# Patient Record
Sex: Female | Born: 2005 | Race: White | Hispanic: No | Marital: Single | Smoking: Former smoker
Health system: Southern US, Community
[De-identification: ages and names within clinical notes are randomized; demographics above are authoritative.]

## PROBLEM LIST (undated history)

## (undated) DIAGNOSIS — F419 Anxiety disorder, unspecified: Secondary | ICD-10-CM

## (undated) DIAGNOSIS — R569 Unspecified convulsions: Secondary | ICD-10-CM

## (undated) DIAGNOSIS — F99 Mental disorder, not otherwise specified: Secondary | ICD-10-CM

## (undated) DIAGNOSIS — R011 Cardiac murmur, unspecified: Secondary | ICD-10-CM

## (undated) HISTORY — PX: TONSILLECTOMY AND ADENOIDECTOMY: SHX28

## (undated) HISTORY — PX: MULTIPLE TOOTH EXTRACTIONS: SHX2053

## (undated) HISTORY — DX: Anxiety disorder, unspecified: F41.9

## (undated) HISTORY — PX: TONSILLECTOMY: SUR1361

## (undated) HISTORY — DX: Mental disorder, not otherwise specified: F99

---
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
## 2023-02-08 IMAGING — US US MFM OB DETAIL+14 WK
1 series · 12 of 28 positions shown · non-contrast
Comparison: none

[Series 1: us mfm ob detail+14 wk · 114 acquisitions, 12 frames shown]
[im 5/114]
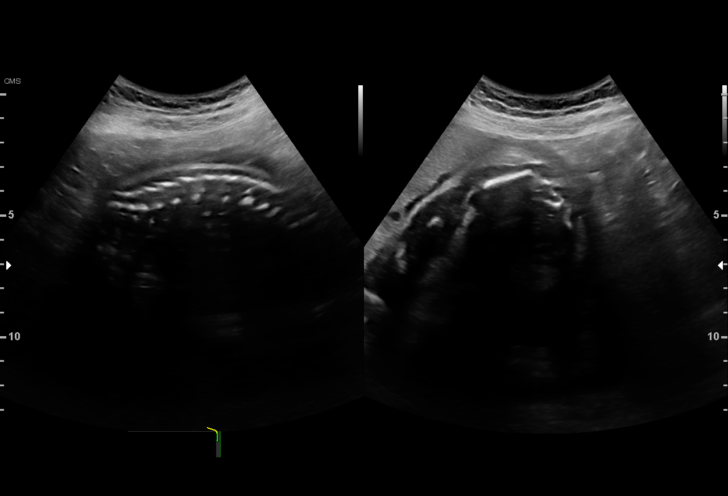
[im 13/114]
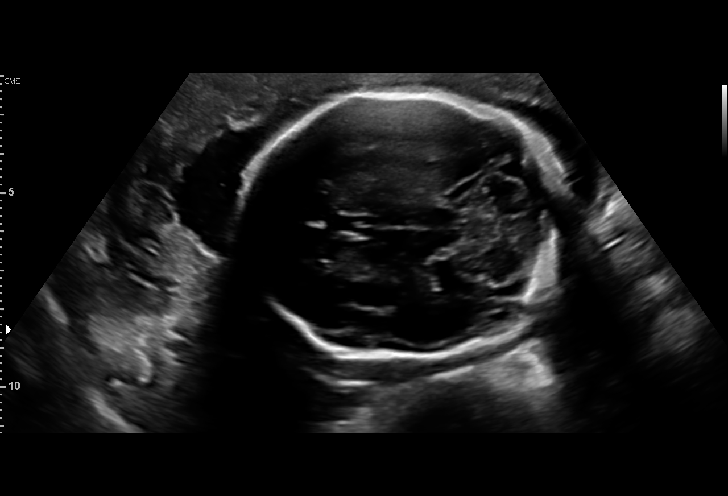
[im 21/114]
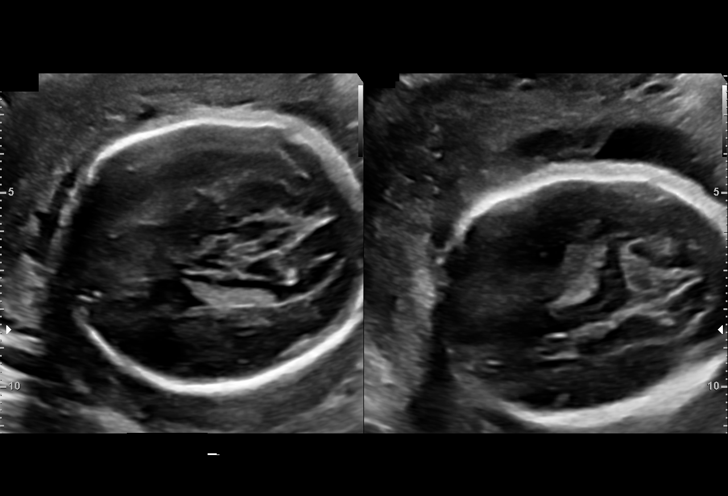
[im 34/114]
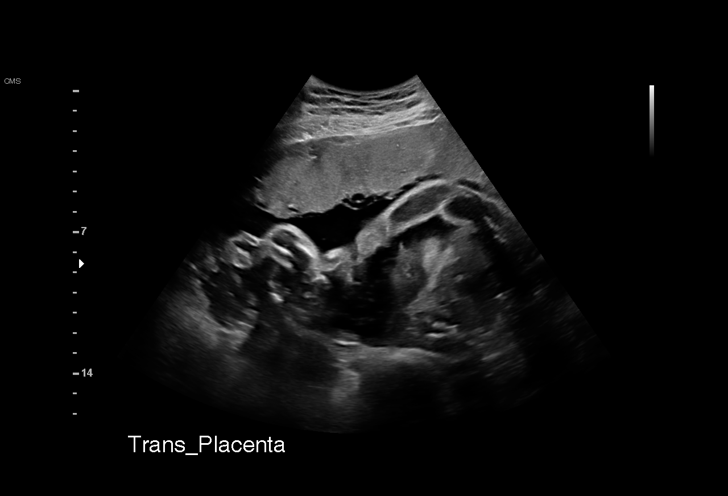
[im 42/114]
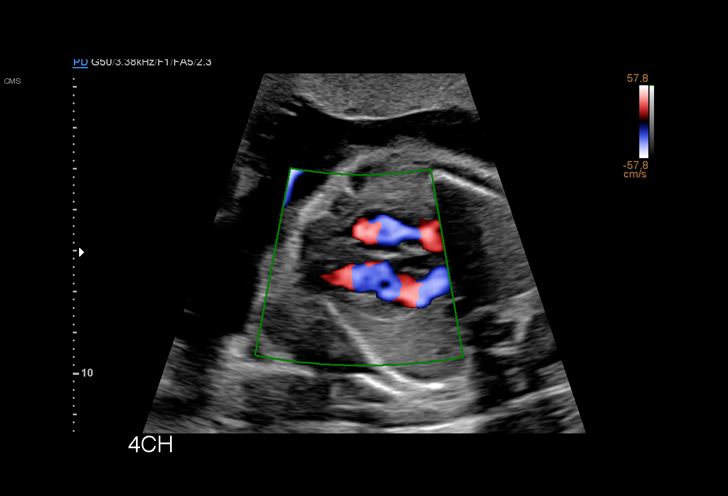
[im 51/114]
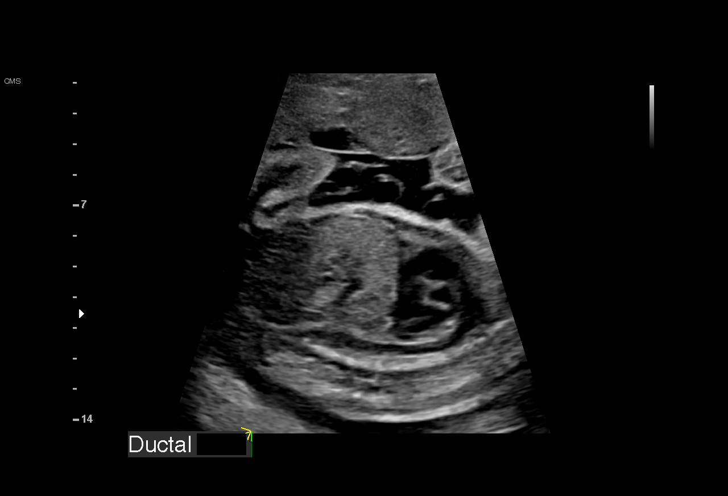
[im 63/114]
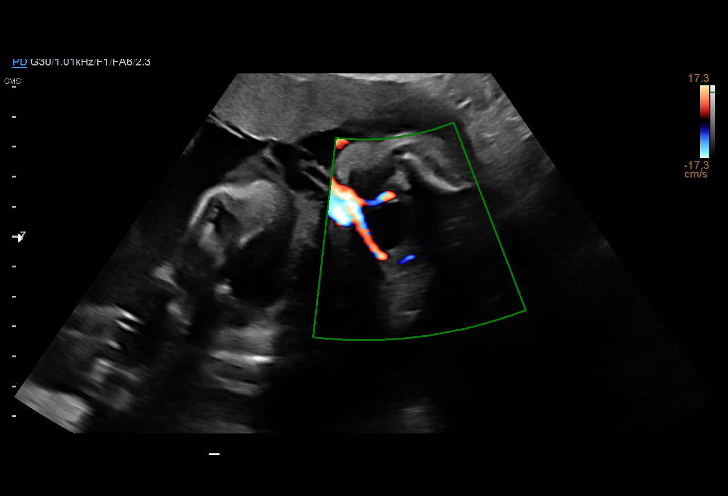
[im 72/114]
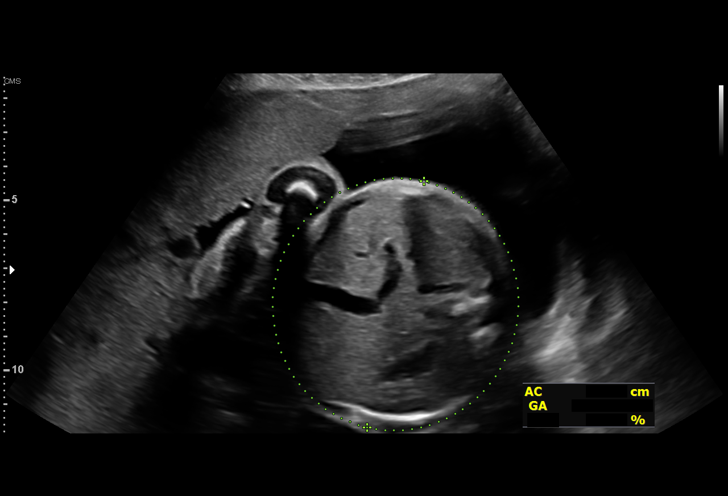
[im 80/114]
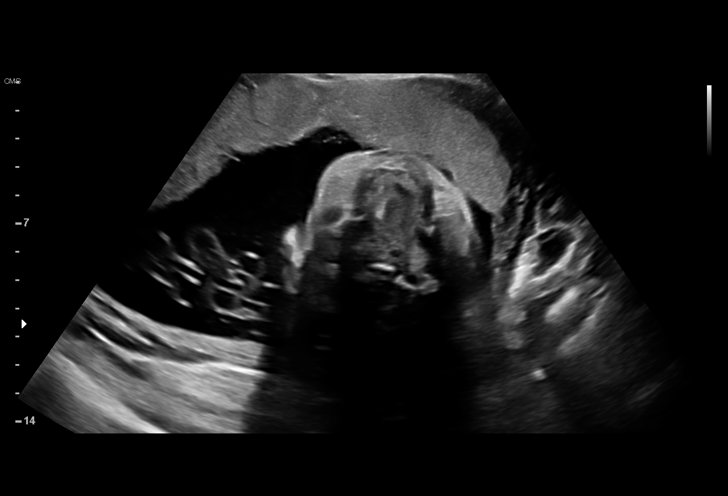
[im 93/114]
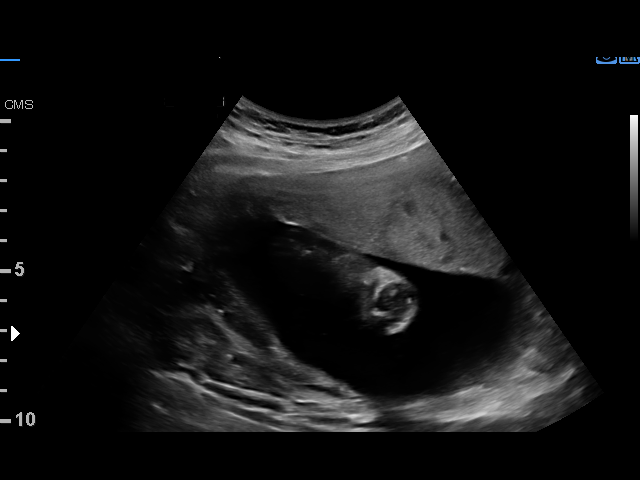
[im 101/114]
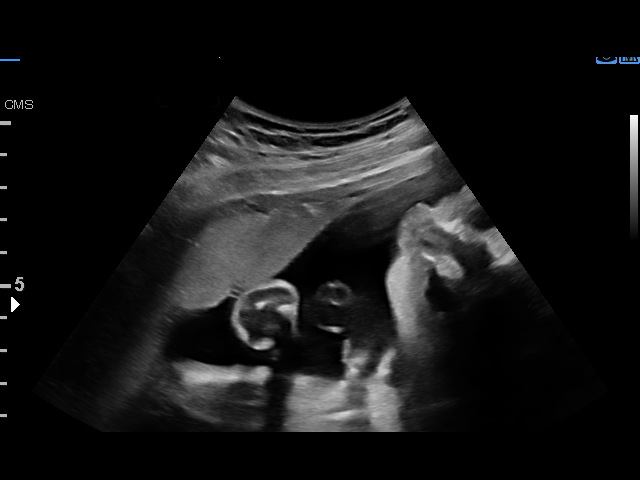
[im 109/114]
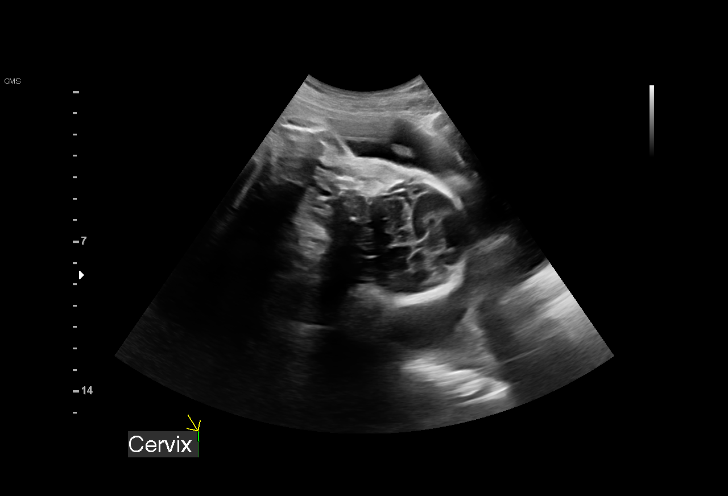

[12 of 28 positions shown; findings below may reference images not displayed]

Indications

 Poor obstetric history: Previous preterm
 delivery, antepartum (34 weeks)
 Encounter for uncertain dates
 27 weeks gestation of pregnancy
 Antenatal screening for malformations
 Short interval between pregancies, 2nd
 trimester
 Heart disease in miother affecting pregnancy
 in second trimester (hx of murmur)
Fetal Evaluation

 Num Of Fetuses:         1
 Fetal Heart Rate(bpm):  157
 Cardiac Activity:       Observed
 Presentation:           Cephalic
 Placenta:               Anterior
 P. Cord Insertion:      Visualized

 Amniotic Fluid
 AFI FV:      Within normal limits

                             Largest Pocket(cm)

Biometry
 BPD:      67.7  mm     G. Age:  27w 2d         32  %    CI:        71.23   %    70 - 86
                                                         FL/HC:      20.0   %    18.6 -
 HC:      255.5  mm     G. Age:  27w 5d         31  %    HC/AC:      1.12        1.05 -
 AC:      227.8  mm     G. Age:  27w 1d         33  %    FL/BPD:     75.6   %    71 - 87
 FL:       51.2  mm     G. Age:  27w 3d         34  %    FL/AC:      22.5   %    20 - 24
 HUM:      46.2  mm     G. Age:  27w 2d         43  %
 CER:      30.5  mm     G. Age:  26w 4d         37  %

 LV:        3.7  mm
 CM:        4.5  mm

 Est. FW:    8183  gm      2 lb 5 oz     33  %
OB History

 Gravidity:    2         Term:   0        Prem:   1        SAB:   0
 TOP:          0       Ectopic:  0        Living: 1
Gestational Age

 LMP:           28w 3d        Date:  07/26/20                 EDD:   05/02/21
 U/S Today:     27w 3d                                        EDD:   05/09/21
 Best:          27w 3d     Det. By:  U/S (02/10/21)           EDD:   05/09/21
Anatomy

 Cranium:               Appears normal         LVOT:                   Appears normal
 Cavum:                 Appears normal         Aortic Arch:            Appears normal
 Ventricles:            Appears normal         Ductal Arch:            Appears normal
 Choroid Plexus:        Appears normal         Diaphragm:              Appears normal
 Cerebellum:            Appears normal         Stomach:                Appears normal, left
                                                                       sided
 Posterior Fossa:       Appears normal         Abdomen:                Appears normal
 Nuchal Fold:           Not applicable (>20    Abdominal Wall:         Appears nml (cord
                        wks GA)                                        insert, abd wall)
 Face:                  Appears normal         Cord Vessels:           Appears normal (3
                        (orbits and profile)                           vessel cord)
 Lips:                  Appears normal         Kidneys:                Appear normal
 Palate:                Appears normal         Bladder:                Appears normal
 Thoracic:              Appears normal         Spine:                  Appears normal
 Heart:                 Appears normal         Upper Extremities:      Appears normal
                        (4CH, axis, and
                        situs)
 RVOT:                  Appears normal         Lower Extremities:      Appears normal
Targeted Anatomy

 Central Nervous System
 Cereb./Vermis:         Appears normal

 Head/Neck
 Nasal Bone:            Present                Mandible:               Appears normal
 Orbits/Eyes:           Nml w/ lenses          Maxilla:                Appears normal

 Thorax
 SVC:                   Appears normal         3 V Trachea View:       Appears normal
 Interventr. Septum:    Appears normal         IVC:                    Appears normal
 3 Vessel View:         Appears normal
 Extremities
 Lt Hand:               Open hand nml          Lt Foot:                Heels nwv
 Rt Hand:               Open hand nml          Rt Foot:                Nml w/ heel

 Other
 Genitalia:             Normal Female
Cervix Uterus Adnexa

 Cervix
 Length:            3.5  cm.
 Normal appearance by transabdominal scan.

 Uterus
 No abnormality visualized.

 Right Ovary
 Within normal limits.

 Left Ovary
 Not visualized.

 Cul De Sac
 No free fluid seen.

 Adnexa
 No abnormality visualized.
Impression

 On today's ultrasound, the fetal biometry is consistent with 27
 weeks and 3 days gestation.  Amniotic fluid is normal good
 fetal activity seen.  Fetal anatomical survey appears normal
 but limited by advanced gestational age.  On transabdominal
 scan, the cervix looks long and closed.
 Patient does not have symptoms of uterine contractions or
 pelvic pressure.
 xxxxxxxxxxxxxxxxxxxxxxxxxxxxxxxxx
 Consultation

 I had the pleasure of seeing Ms. Biggs today at the
 [HOSPITAL]. She is G2 P1 at unknown
 gestational age and is here for ultrasound and consultation.
 She initiated her prenatal care late in pregnancy and has not
 had ultrasound in this pregnancy.
 She has not had screening for fetal aneuploidies.  She does
 not have gestational diabetes.
 Obstetric history is significant for a preterm vaginal delivery in
 December 2019 at 34 weeks gestation of a female infant weighing
 5 pounds and 8 ounces at birth.  The newborn was
 discharged after 2-week stay at NICU.
 She had spontaneous preterm birth.  Patient had 1
 ultrasound in that pregnancy and there was no mention of
 cervical shortening.
 GYN history: No history of cervical surgeries.
 Past medical history: No history of diabetes or hypertension
 or any chronic medical conditions.
 Past surgical history: Tonsillectomy.
 Medications: Prenatal vitamins.
 Allergies: Amoxicillin (hives), penicillin (hives).
 Social history: Denies tobacco or drug or alcohol use.  Her
 partner is an African-American and he is in good health he is
 now the father of her first child.
 Family history: No history of venous thromboembolism in the
 family.
 Blood pressure today at her office is 109/66 mmHg.
 Our concerns include
 History of spontaneous preterm birth
 This is the single most-common cause of recurrent preterm
 birth. I discussed progesterone prophylaxis that is usually
 initiated before 24 weeks' gestation. In the absence of
 infection or bleeding, her previous preterm birth has no
 identifiable cause.
 I discussed general measures including good hydration and
 educated her on the symptoms and signs of preterm labor. I
 briefly discussed tocolysis and antenatal corticosteroids if she
 goes into preterm labor.
Recommendations

 -An appointment was made for her to return in 4 weeks for
 fetal growth assessment.
                 Ranks, Kofi Mensah

## 2023-06-16 ENCOUNTER — Emergency Department (HOSPITAL_COMMUNITY): Payer: MEDICAID

## 2023-06-16 ENCOUNTER — Emergency Department (HOSPITAL_COMMUNITY)
Admission: EM | Admit: 2023-06-16 | Discharge: 2023-06-17 | Disposition: A | Discharge status: Home / Self Care | Payer: MEDICAID | Attending: Emergency Medicine | Admitting: Emergency Medicine

## 2023-06-16 ENCOUNTER — Encounter (HOSPITAL_COMMUNITY): Payer: Self-pay

## 2023-06-16 ENCOUNTER — Other Ambulatory Visit: Payer: Self-pay

## 2023-06-16 DIAGNOSIS — R630 Anorexia: Secondary | ICD-10-CM | POA: Insufficient documentation

## 2023-06-16 DIAGNOSIS — R1031 Right lower quadrant pain: Secondary | ICD-10-CM | POA: Insufficient documentation

## 2023-06-16 DIAGNOSIS — R109 Unspecified abdominal pain: Secondary | ICD-10-CM

## 2023-06-16 DIAGNOSIS — R197 Diarrhea, unspecified: Secondary | ICD-10-CM | POA: Insufficient documentation

## 2023-06-16 DIAGNOSIS — R112 Nausea with vomiting, unspecified: Secondary | ICD-10-CM | POA: Insufficient documentation

## 2023-06-16 HISTORY — DX: Cardiac murmur, unspecified: R01.1

## 2023-06-16 HISTORY — DX: Unspecified convulsions: R56.9

## 2023-06-16 LAB — COMPREHENSIVE METABOLIC PANEL
ALT: 13 U/L (ref 0–44)
AST: 18 U/L (ref 15–41)
Albumin: 4.2 g/dL (ref 3.5–5.0)
Alkaline Phosphatase: 54 U/L (ref 38–126)
Anion gap: 8 (ref 5–15)
BUN: 9 mg/dL (ref 6–20)
CO2: 20 mmol/L — ABNORMAL LOW (ref 22–32)
Calcium: 9.2 mg/dL (ref 8.9–10.3)
Chloride: 111 mmol/L (ref 98–111)
Creatinine, Ser: 0.84 mg/dL (ref 0.44–1.00)
GFR, Estimated: >60 mL/min (ref >60)
Glucose, Bld: 92 mg/dL (ref 70–99)
Potassium: 3.6 mmol/L (ref 3.5–5.1)
Sodium: 139 mmol/L (ref 135–145)
Total Bilirubin: 0.7 mg/dL (ref <1.2)
Total Protein: 6.8 g/dL (ref 6.5–8.1)

## 2023-06-16 LAB — CBC
HCT: 38.7 % (ref 36.0–46.0)
Hemoglobin: 12.8 g/dL (ref 12.0–15.0)
MCH: 31.8 pg (ref 26.0–34.0)
MCHC: 33.1 g/dL (ref 30.0–36.0)
MCV: 96 fL (ref 80.0–100.0)
Platelets: 199 10*3/uL (ref 150–400)
RBC: 4.03 MIL/uL (ref 3.87–5.11)
RDW: 12.8 % (ref 11.5–15.5)
WBC: 6.9 10*3/uL (ref 4.0–10.5)
nRBC: 0 % (ref 0.0–0.2)

## 2023-06-16 LAB — URINALYSIS, ROUTINE W REFLEX MICROSCOPIC
Bilirubin Urine: NEGATIVE
Glucose, UA: NEGATIVE mg/dL
Hgb urine dipstick: NEGATIVE
Ketones, ur: 5 mg/dL — AB
Nitrite: NEGATIVE
Protein, ur: NEGATIVE mg/dL
Specific Gravity, Urine: 1.016 (ref 1.005–1.030)
pH: 5 (ref 5.0–8.0)

## 2023-06-16 LAB — HCG, SERUM, QUALITATIVE: Preg, Serum: NEGATIVE

## 2023-06-16 LAB — LIPASE, BLOOD: Lipase: 28 U/L (ref 11–51)

## 2023-06-16 MED ORDER — MORPHINE SULFATE (PF) 4 MG/ML IV SOLN
4.0000 mg | Freq: Once | INTRAVENOUS | Status: DC
  Filled 2023-06-16: qty 1

## 2023-06-16 MED ORDER — ONDANSETRON 4 MG PO TBDP
4.0000 mg | ORAL_TABLET | Freq: Once | ORAL | Status: AC | PRN
  Administered 2023-06-16: 4 mg via ORAL
  Filled 2023-06-16: qty 1

## 2023-06-16 NOTE — ED Triage Notes (Signed)
PER EMS: pt is from home with RLQ abd pain that started on Sunday associated with vomiting and diarrhea. LMP 2 weeks ago. Emesis x 5 today.   BP-146/100, HR-90, O2-100% RA, CBG-108

## 2023-06-16 NOTE — ED Provider Triage Note (Signed)
Emergency Medicine Provider Triage Evaluation Note  Michelle Mullen , a 17 y.o. female  was evaluated in triage.  Pt complains of abdominal pain. Started Sunday night/Monday morning, Located RLQ. Stated with nausea/vomiting. Pain is constant, rated 7/10. No h/o similar. No fevers, urinary sxs. Endorses BRBPR two days ago, noticed when she wiped, not mixed in with stool. No h/o similar. LMP two weeks ago, has a nexplanon. [id number]. No vaginal sxs. Does have h/o asthma. No h/o abdominal surgery.  Review of Systems  Positive: N/V, BRBPR Negative: Fevers/chills, urinary sxs, CP, SOB  Physical Exam  BP 122/77   Pulse 68   Temp 98.4 F (36.9 C) (Oral)   Resp 16   Ht 5\' 7"  (1.702 m)   Wt 68 kg   LMP 06/02/2023 (Approximate)   SpO2 100%   BMI 23.49 kg/m  Gen:   Awake, no distress   Resp:  Normal effort  MSK:   Moves extremities without difficulty  Other:  +RLQ abd pain to palpation. No CVA TTP.  Medical Decision Making  Medically screening exam initiated at 10:39 PM.  Appropriate orders placed.  Michelle Mullen was informed that the remainder of the evaluation will be completed by another provider, this initial triage assessment does not replace that evaluation, and the importance of remaining in the ED until their evaluation is complete.  Will order pain medicine and CT abd pelvis to r/o appendicitis.    Loetta Rough, MD 06/16/23 [phone]

## 2023-06-17 MED ORDER — ONDANSETRON 4 MG PO TBDP: 4.0000 mg | ORAL_TABLET | Freq: Three times a day (TID) | ORAL | 0 refills | Status: AC | PRN

## 2023-06-17 MED ORDER — HYOSCYAMINE SULFATE 0.125 MG SL SUBL: 0.1250 mg | SUBLINGUAL_TABLET | Freq: Four times a day (QID) | SUBLINGUAL | 0 refills | Status: AC | PRN

## 2023-06-17 MED ORDER — ONDANSETRON 4 MG PO TBDP: 4.0000 mg | ORAL_TABLET | Freq: Three times a day (TID) | ORAL | 0 refills | Status: DC | PRN

## 2023-06-17 MED ORDER — ONDANSETRON HCL 4 MG/2ML IJ SOLN
4.0000 mg | Freq: Once | INTRAMUSCULAR | Status: AC
Start: 2023-06-17 — End: 2023-06-17
  Administered 2023-06-17: 4 mg via INTRAVENOUS
  Filled 2023-06-17: qty 2

## 2023-06-17 MED ORDER — HYOSCYAMINE SULFATE 0.125 MG SL SUBL: 0.1250 mg | SUBLINGUAL_TABLET | Freq: Four times a day (QID) | SUBLINGUAL | 0 refills | Status: DC | PRN

## 2023-06-17 MED ORDER — HYOSCYAMINE SULFATE 0.125 MG PO TBDP
0.1250 mg | ORAL_TABLET | Freq: Once | ORAL | Status: AC
  Administered 2023-06-17: 0.125 mg via SUBLINGUAL
  Filled 2023-06-17: qty 1

## 2023-06-17 MED ORDER — HYOSCYAMINE SULFATE 0.125 MG PO TABS
0.1250 mg | ORAL_TABLET | Freq: Once | ORAL | Status: DC
  Filled 2023-06-17: qty 1

## 2023-06-17 NOTE — ED Provider Notes (Signed)
[place] EMERGENCY DEPARTMENT AT [facility] Provider Note  CSN: [id number] Arrival date & time: 06/16/23 2120  Chief Complaint(s) Abdominal Pain  HPI Michelle Mullen is a 17 y.o. female here for several days of nausea, vomiting, diarrhea with intermittent cramping pain in the right lower quadrant.  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  Reports decreased oral intake due to nausea and vomiting.  No respiratory symptoms.  No urinary symptoms.  Denies any vaginal discharge and states she has not been intimate in almost 8 months.  The history is provided by the patient.    Past Medical History Past Medical History:  Diagnosis Date   Anxiety    Heart murmur    History of preterm delivery 01/22/2021   Mental disorder    bipolar   Positive GBS test 04/22/2021   Preterm labor    Seizures (HCC)    There are no problems to display for this patient.  Home Medication(s) Prior to Admission medications   Medication Sig Start Date End Date Taking? Authorizing Provider  Blood Pressure Monitoring (BLOOD PRESSURE KIT) DEVI 1 Device by Does not apply route as needed. Patient not taking: Reported on 05/04/2021 01/22/21   Warden Fillers, MD  etonogestrel (NEXPLANON) 68 MG IMPL implant 1 each by Subdermal route once. Patient not taking: Reported on 08/10/2021 05/09/21   [provider]  hyoscyamine (LEVSIN/SL) 0.125 MG SL tablet Place 1 tablet (0.125 mg total) under the tongue 4 (four) times daily as needed for up to 5 days. 06/17/23 06/22/23  Nira Conn, MD  ibuprofen (ADVIL) 600 MG tablet Take 1 tablet (600 mg total) by mouth every 6 (six) hours. Patient not taking: Reported on 08/10/2021 05/10/21   Katrinka Blazing, [place], CNM  ondansetron (ZOFRAN-ODT) 4 MG disintegrating tablet Take 1 tablet (4 mg total) by mouth every 8 (eight) hours as needed for up to 3 days for nausea or vomiting. 06/17/23 06/20/23  Nira Conn, MD   Prenatal Vit-Fe Fumarate-FA (PREPLUS) 27-1 MG TABS Take 1 tablet by mouth daily. Patient not taking: Reported on 08/10/2021 02/06/21   Venora Maples, MD                                                                                                                                    Allergies Amoxicillin, Penicillins, and Iodine  Review of Systems Review of Systems As noted in HPI  Physical Exam Vital Signs  I have reviewed the triage vital signs BP 111/79 (BP Location: Right Arm)   Pulse 68   Temp 97.9 F (36.6 C)   Resp 16   Ht 5\' 7"  (1.702 m)   Wt 68 kg   LMP 06/02/2023 (Approximate)   SpO2 100%   BMI 23.49 kg/m   Physical Exam Vitals reviewed.  Constitutional:      General: She is not in acute distress.  Appearance: She is well-developed. She is not diaphoretic.  HENT:     Head: Normocephalic and atraumatic.     Right Ear: External ear normal.     Left Ear: External ear normal.     Nose: Nose normal.  Eyes:     General: No scleral icterus.    Conjunctiva/sclera: Conjunctivae normal.  Neck:     Trachea: Phonation normal.  Cardiovascular:     Rate and Rhythm: Normal rate and regular rhythm.  Pulmonary:     Effort: Pulmonary effort is normal. No respiratory distress.     Breath sounds: No stridor.  Abdominal:     General: There is no distension.     Tenderness: There is abdominal tenderness (mild) in the right lower quadrant. There is no guarding or rebound. Negative signs include Rovsing's sign and McBurney's sign.  Musculoskeletal:        General: Normal range of motion.     Cervical back: Normal range of motion.  Neurological:     Mental Status: She is alert and oriented to person, place, and time.  Psychiatric:        Behavior: Behavior normal.     ED Results and Treatments Labs (all labs ordered are listed, but only abnormal results are displayed) Labs Reviewed  COMPREHENSIVE METABOLIC PANEL - Abnormal; Notable for the following components:       Result Value   CO2 20 (*)    All other components within normal limits  URINALYSIS, ROUTINE W REFLEX MICROSCOPIC - Abnormal; Notable for the following components:   APPearance HAZY (*)    Ketones, ur 5 (*)    Leukocytes,Ua SMALL (*)    Bacteria, UA RARE (*)    All other components within normal limits  LIPASE, BLOOD  CBC  HCG, SERUM, QUALITATIVE  POC OCCULT BLOOD, ED                                                                                                                         EKG  EKG Interpretation Date/Time:    Ventricular Rate:    PR Interval:    QRS Duration:    QT Interval:    QTC Calculation:   R Axis:      Text Interpretation:         Radiology CT ABDOMEN PELVIS WO CONTRAST  Result Date: 06/16/2023 CLINICAL DATA:  Right lower quadrant pain EXAM: CT ABDOMEN AND PELVIS WITHOUT CONTRAST TECHNIQUE: Multidetector CT imaging of the abdomen and pelvis was performed following the standard protocol without IV contrast. RADIATION DOSE REDUCTION: This exam was performed according to the departmental dose-optimization program which includes automated exposure control, adjustment of the mA and/or kV according to patient size and/or use of iterative reconstruction technique. COMPARISON:  None Available. FINDINGS: Lower chest: No acute abnormality Hepatobiliary: No focal hepatic abnormality. Gallbladder unremarkable. Pancreas: No focal abnormality or ductal dilatation. Spleen: No focal abnormality.  Normal size. Adrenals/Urinary Tract: No adrenal abnormality. No focal renal abnormality. No  stones or hydronephrosis. Urinary bladder is unremarkable. Stomach/Bowel: Stomach, large and small bowel grossly unremarkable. Normal appendix. Vascular/Lymphatic: No evidence of aneurysm or adenopathy. Reproductive: Uterus and adnexa unremarkable.  No mass. Other: No free fluid or free air. Musculoskeletal: No acute bony abnormality. IMPRESSION: No acute findings in the abdomen or pelvis.  Electronically Signed   By: Charlett Nose M.D.   On: 06/16/2023 23:38    Medications Ordered in ED Medications  ondansetron (ZOFRAN-ODT) disintegrating tablet 4 mg (4 mg Oral Given 06/16/23 2137)  ondansetron (ZOFRAN) injection 4 mg (4 mg Intravenous Given 06/17/23 0253)  hyoscyamine (ANASPAZ) disintergrating tablet 0.125 mg (0.125 mg Sublingual Given 06/17/23 4098)   Procedures Procedures  (including critical care time) Medical Decision Making / ED Course   Medical Decision Making Amount and/or Complexity of Data Reviewed Labs: ordered. Decision-making details documented in ED Course. Radiology: ordered and independent interpretation performed. Decision-making details documented in ED Course.  Risk Prescription drug management.    Lower abdominal pain with nausea, vomiting diarrhea. Given likely sick contacts, favor viral gastroenteritis.  Patient was seen in the MSE process and had labs obtained showing CBC without leukocytosis or anemia; CMP without significant electrolyte derangements or renal sufficiency; no biliary obstruction or pancreatitis; UA contaminated with skin cells and not overtly concerning for infection given her lack of urinary symptoms; hCG was negative ruling out pregnancy related process.  Patient also had a CT scan that was negative for any acute intra-abdominal inflammatory/infectious process or bowel obstruction.  My concern for torsion is low at this time.  Given her lack of intimacy in the last several months, doubt PID.  Patient was treated symptomatically with Levsin and IV Zofran.  Patient reported improvement in her cramping pain.  She was able to tolerate p.o. intake.     Final Clinical Impression(s) / ED Diagnoses Final diagnoses:  Nausea vomiting and diarrhea  Abdominal cramping   The patient appears reasonably screened and/or stabilized for discharge and I doubt any other medical condition or other [facility] requiring further screening, evaluation, or  treatment in the ED at this time. I have discussed the findings, Dx and Tx plan with the patient/family who expressed understanding and agree(s) with the plan. Discharge instructions discussed at length. The patient/family was given strict return precautions who verbalized understanding of the instructions. No further questions at time of discharge.  Disposition: Discharge  Condition: Good  ED Discharge Orders          Ordered    hyoscyamine (LEVSIN/SL) 0.125 MG SL tablet  4 times daily PRN        06/17/23 0421    ondansetron (ZOFRAN-ODT) 4 MG disintegrating tablet  Every 8 hours PRN        06/17/23 0421             Follow Up: Primary care provider  Call  to schedule an appointment for close follow up    This chart was dictated using voice recognition software.  Despite best efforts to proofread,  errors can occur which can change the documentation meaning.    Nira Conn, MD 06/17/23 [phone]
